# Patient Record
Sex: Female | Born: 1975 | Race: White | Hispanic: No | Marital: Single | Smoking: Never smoker
Health system: Southern US, Community
[De-identification: ages and names within clinical notes are randomized; demographics above are authoritative.]

## PROBLEM LIST (undated history)

## (undated) HISTORY — PX: NO PAST SURGERIES: SHX2092

---

## 2002-04-29 ENCOUNTER — Other Ambulatory Visit: Admission: RE | Admit: 2002-04-29 | Discharge: 2002-04-29 | Payer: Self-pay | Admitting: Obstetrics and Gynecology

## 2004-03-06 ENCOUNTER — Emergency Department (HOSPITAL_COMMUNITY): Admission: EM | Admit: 2004-03-06 | Discharge: 2004-03-06 | Payer: Self-pay | Admitting: Emergency Medicine

## 2007-04-07 ENCOUNTER — Ambulatory Visit: Payer: Self-pay | Admitting: Internal Medicine

## 2010-03-20 ENCOUNTER — Encounter: Admission: RE | Admit: 2010-03-20 | Discharge: 2010-03-20 | Payer: Self-pay | Admitting: Emergency Medicine

## 2012-09-24 ENCOUNTER — Ambulatory Visit (INDEPENDENT_AMBULATORY_CARE_PROVIDER_SITE_OTHER): Payer: BC Managed Care – PPO | Admitting: Emergency Medicine

## 2012-09-24 ENCOUNTER — Ambulatory Visit: Payer: BC Managed Care – PPO

## 2012-09-24 VITALS — BP 90/70 | HR 94 | Temp 98.5°F | Resp 18 | Wt 130.0 lb

## 2012-09-24 DIAGNOSIS — R079 Chest pain, unspecified: Secondary | ICD-10-CM

## 2012-09-24 DIAGNOSIS — J209 Acute bronchitis, unspecified: Secondary | ICD-10-CM

## 2012-09-24 NOTE — Progress Notes (Signed)
[facility] [address] [place] [address] [phone]  Date:  09/24/2012   Name:  Betty Diaz   DOB:  03/20/1976   MRN:  [id number]  PCP:  No primary provider on file.    Chief Complaint: URI and chest congestion   History of Present Illness:  Betty Diaz is a 36 y.o. very pleasant female patient who presents with the following:  Thursday working with a pile of money and spent the afternoon counting the bills. Since has experienced a sense of pressure in her anterior chest at the base of the throat.  Feels as though she needs to cough but cannot.  Denies wheezing or shortness of breath.  No DOE or orthopnea.  No radiation of discomfort. No nausea or vomiting.  No palpitations or rapid heart rate.  Denies indigestion or heart burn.  No waterbrash.  No trauma or overuse.  Symptoms are constant and unrelenting not affected by activity.  There is no problem list on file for this patient.   No past medical history on file.  No past surgical history on file.  History  Substance Use Topics  . Smoking status: Never Smoker   . Smokeless tobacco: Not on file  . Alcohol Use: Not on file    No family history on file.  Allergies  Allergen Reactions  . Penicillins     Medication list has been reviewed and updated.  No current outpatient prescriptions on file prior to visit.    Review of Systems:  As per HPI, otherwise negative.    Physical Examination: Filed Vitals:   09/24/12 1115  BP: 90/70  Pulse: 94  Temp: 98.5 F (36.9 C)  Resp: 18   Filed Vitals:   09/24/12 1115  Weight: 130 lb (58.968 kg)   There is no height on file to calculate BMI. Ideal Body Weight:    GEN: WDWN, NAD, Non-toxic, A & O x 3.  No sepsis rash or shortness of breath HEENT: Atraumatic, Normocephalic. Neck supple. No masses, No LAD. Ears and Nose: No external deformity. CV: RRR, No M/G/R. No JVD. No thrill. No extra heart sounds. PULM: CTA B, no  wheezes, crackles, rhonchi. No retractions. No resp. distress. No accessory muscle use. ABD: S, NT, ND, +BS. No rebound. No HSM. EXTR: No c/c/e NEURO Normal gait.  PSYCH: Normally interactive. Conversant. Not depressed or anxious appearing.  Calm demeanor.    Assessment and Plan: Chest pain Cough mucinex dm  Carmelina Dane, MD  [facility] reading (PRIMARY) by  Dr. Dareen Piano.  Normal chest.  I have reviewed and agree with documentation. Robert P. Merla Riches, M.D.

## 2013-12-07 ENCOUNTER — Ambulatory Visit: Payer: BC Managed Care – PPO

## 2013-12-07 ENCOUNTER — Ambulatory Visit (INDEPENDENT_AMBULATORY_CARE_PROVIDER_SITE_OTHER): Payer: BC Managed Care – PPO | Admitting: Internal Medicine

## 2013-12-07 VITALS — BP 112/70 | HR 75 | Temp 99.5°F | Resp 18 | Ht 64.5 in | Wt 131.0 lb

## 2013-12-07 DIAGNOSIS — R110 Nausea: Secondary | ICD-10-CM

## 2013-12-07 DIAGNOSIS — M545 Low back pain, unspecified: Secondary | ICD-10-CM

## 2013-12-07 DIAGNOSIS — R109 Unspecified abdominal pain: Secondary | ICD-10-CM

## 2013-12-07 DIAGNOSIS — R5383 Other fatigue: Secondary | ICD-10-CM

## 2013-12-07 DIAGNOSIS — M549 Dorsalgia, unspecified: Secondary | ICD-10-CM

## 2013-12-07 DIAGNOSIS — R5381 Other malaise: Secondary | ICD-10-CM

## 2013-12-07 LAB — COMPREHENSIVE METABOLIC PANEL
ALT: 29 U/L (ref 0–35)
AST: 29 U/L (ref 0–37)
Albumin: 4.5 g/dL (ref 3.5–5.2)
BUN: 8 mg/dL (ref 6–23)
CO2: 27 mEq/L (ref 19–32)
Chloride: 105 mEq/L (ref 96–112)
Creat: 0.56 mg/dL (ref 0.50–1.10)
Potassium: 4.2 mEq/L (ref 3.5–5.3)
Sodium: 137 mEq/L (ref 135–145)
Total Bilirubin: 0.3 mg/dL (ref 0.3–1.2)
Total Protein: 6.8 g/dL (ref 6.0–8.3)

## 2013-12-07 LAB — POCT URINALYSIS DIPSTICK
Ketones, UA: NEGATIVE
Protein, UA: NEGATIVE
Urobilinogen, UA: 0.2

## 2013-12-07 LAB — POCT UA - MICROSCOPIC ONLY
Casts, Ur, LPF, POC: NEGATIVE
Mucus, UA: NEGATIVE
Yeast, UA: NEGATIVE

## 2013-12-07 LAB — POCT CBC
HCT, POC: 39.8 % (ref 37.7–47.9)
Lymph, poc: 2.3 (ref 0.6–3.4)
MPV: 10.6 fL (ref 0–99.8)
POC LYMPH PERCENT: 29.9 %L (ref 10–50)
POC MID %: 7.3 %M (ref 0–12)
RBC: 4.08 M/uL (ref 4.04–5.48)
WBC: 7.6 10*3/uL (ref 4.6–10.2)

## 2013-12-07 LAB — TSH: TSH: 1.819 u[IU]/mL (ref 0.350–4.500)

## 2013-12-07 MED ORDER — POLYETHYLENE GLYCOL 3350 17 GM/SCOOP PO POWD: ORAL | Status: DC

## 2013-12-07 NOTE — Progress Notes (Signed)
This chart was scribed for Ellamae Sia, MD by Joaquin Music, ED Scribe. This patient was seen in room Room/bed 13 and the patient's care was started at 11:31 AM. Subjective:    Patient ID: Betty Diaz, female    DOB: Apr 10, 1976, 37 y.o.   MRN: [id number] Chief Complaint  Patient presents with  . Back Pain    X7 DAYS  . Nausea   HPI Betty Diaz is a 37 y.o. female who [facility] complaining of ongoing worsening L sided back pain, nausea, and decrease appetite and increase fatigue for the past 2 weeks that began 1 week ago. Pt states she has been monitoring her symptoms and is unsure why she is still having back pain. She suspects she is having L sided back spasms. She states her symptoms have "been all over the place and is unsure what may be her problem". She states she has been taking Iburprofen and applying Icy/Hot ointment without relief. She states her symptoms worsen during the morning but she has generally been worsening. She states she has had an increase in fatigue. Pt states she has been drinking water and cranberry juice to stay hydrated and due to her being concerned of a potential kidney infection. Pt denies dysuria, fever, cough, sore throat, back pain with deep breaths & movement.   Pt states as a child, she had kidney stones but denies having kidney stones as an adult. Pt states her LNMP was November 21st. Pt denies being sexually active and denies concern of pregnancy.  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 She states she gets 6-7 hours of sleep and states her sleep pattern has been normal and consistent.  History   Social History  . Marital Status: Married    Spouse Name: N/A    Number of Children: N/A  . Years of Education: N/A   Social History Main Topics  . Smoking status: Never Smoker   . Smokeless tobacco: None  . Alcohol Use: None  . Drug Use: None  . Sexual Activity: None   Other Topics Concern  . None   Social  History Narrative  . None   History reviewed. No pertinent past surgical history.  History reviewed. No pertinent family history.  Current outpatient prescriptions:ibuprofen (ADVIL,MOTRIN) 200 MG tablet, Take 200 mg by mouth every 6 (six) hours as needed., Disp: , Rfl:   Review of Systems A complete 10 system review of systems was obtained and all systems are negative except as noted in the HPI and PMH.   Objective:   Physical Exam Neck supple No cervical adenopathy Chest clear Heart regular without murmur Rib cage nontender Abdomen supple without organomegaly or masses or tenderness to palpation  No flank pain to percussion Thoracic spine nontender Lumbar spine nontender Straight leg raise normal bilaterally  [facility] reading (PRIMARY) by  Dr. Merla Riches normal with the addition of lots of stool, and air in the left splenic flexure within the bowel Results for orders placed in visit on 12/07/13  POCT URINALYSIS DIPSTICK      Result Value Range   Color, UA yellow     Clarity, UA clear     Glucose, UA neg     Bilirubin, UA neg     Ketones, UA neg     Spec Grav, UA <=1.005     Blood, UA moderate     pH, UA 5.0     Protein, UA neg     Urobilinogen, UA 0.2  Nitrite, UA neg     Leukocytes, UA Trace    POCT UA - MICROSCOPIC ONLY      Result Value Range   WBC, Ur, HPF, POC 0-2     RBC, urine, microscopic 0-3     Bacteria, U Microscopic trace     Mucus, UA neg     Epithelial cells, urine per micros 0-2     Crystals, Ur, HPF, POC neg     Casts, Ur, LPF, POC neg     Yeast, UA neg    POCT CBC      Result Value Range   WBC 7.6  4.6 - 10.2 K/uL   Lymph, poc 2.3  0.6 - 3.4   POC LYMPH PERCENT 29.9  10 - 50 %L   MID (cbc) 0.6  0 - 0.9   POC MID % 7.3  0 - 12 %M   POC Granulocyte 4.8  2 - 6.9   Granulocyte percent 62.8  37 - 80 %G   RBC 4.08  4.04 - 5.48 M/uL   Hemoglobin 12.6  12.2 - 16.2 g/dL   HCT, POC 40.9  81.1 - 47.9 %   MCV 97.6 (*) 80 - 97 fL   MCH, POC 30.9  27  - 31.2 pg   MCHC 31.7 (*) 31.8 - 35.4 g/dL   RDW, POC 91.4     Platelet Count, POC 221  142 - 424 K/uL   MPV 10.6  0 - 99.8 fL    Triage Vitals:BP 112/70  Pulse 75  Temp(Src) 99.5 F (37.5 C) (Oral)  Resp 18  Ht 5' 4.5" (1.638 m)  Wt 131 lb (59.421 kg)  BMI 22.15 kg/m2  SpO2 99%  LMP 10/30/2013 Assessment & Plan:   Back pain - Plan: POCT urinalysis dipstick, POCT UA - Microscopic Only, POCT CBC, POCT SEDIMENTATION RATE, Comprehensive metabolic panel, TSH, Urine culture, DG Lumbar Spine 2-3 Views  Nausea alone - Plan: POCT urinalysis dipstick, POCT UA - Microscopic Only, POCT CBC, POCT SEDIMENTATION RATE, Comprehensive metabolic panel, TSH, Urine culture  Flank pain - Plan: POCT CBC, POCT SEDIMENTATION RATE, Comprehensive metabolic panel, TSH, Urine culture, DG Lumbar Spine 2-3 Views  Fatigue - Plan: POCT CBC, POCT SEDIMENTATION RATE, Comprehensive metabolic panel, TSH, Urine culture   Constipation as a likely etiology and she will use MiraLax and followup in one week if not well///will contact about other labs I personally performed the services described in this documentation, which was scribed in my presence. The recorded information has been reviewed and is accurate.

## 2013-12-08 ENCOUNTER — Encounter: Payer: Self-pay | Admitting: Internal Medicine

## 2013-12-08 LAB — URINE CULTURE: Colony Count: NO GROWTH

## 2014-03-29 ENCOUNTER — Other Ambulatory Visit: Payer: Self-pay | Admitting: Obstetrics and Gynecology

## 2014-03-29 DIAGNOSIS — R928 Other abnormal and inconclusive findings on diagnostic imaging of breast: Secondary | ICD-10-CM

## 2014-04-07 ENCOUNTER — Ambulatory Visit
Admission: RE | Admit: 2014-04-07 | Discharge: 2014-04-07 | Disposition: A | Discharge status: Home / Self Care | Payer: BC Managed Care – PPO | Source: Ambulatory Visit | Attending: Obstetrics and Gynecology | Admitting: Obstetrics and Gynecology

## 2014-04-07 ENCOUNTER — Other Ambulatory Visit: Payer: Self-pay | Admitting: Obstetrics and Gynecology

## 2014-04-07 DIAGNOSIS — R928 Other abnormal and inconclusive findings on diagnostic imaging of breast: Secondary | ICD-10-CM

## 2014-07-04 ENCOUNTER — Telehealth: Payer: Self-pay

## 2014-07-04 NOTE — Telephone Encounter (Signed)
Patient came in to [id number] today stating that she needs a refill on a cream that she was given for some sort of allergy. Patient does not know the name of the medication or what allergy it was used for. States the prescription she had expired and she now needs a new one. Please return call and advise. CB # [phone]

## 2014-07-05 NOTE — Telephone Encounter (Signed)
lmom for pt to cb

## 2014-07-05 NOTE — Telephone Encounter (Signed)
Spoke to pt she is aware the medication she is requesting will require an office visit for evaluation.

## 2014-07-14 ENCOUNTER — Ambulatory Visit (INDEPENDENT_AMBULATORY_CARE_PROVIDER_SITE_OTHER): Payer: BC Managed Care – PPO | Admitting: Physician Assistant

## 2014-07-14 VITALS — BP 110/58 | HR 66 | Temp 97.4°F | Resp 16 | Ht 63.5 in | Wt 129.0 lb

## 2014-07-14 DIAGNOSIS — R21 Rash and other nonspecific skin eruption: Secondary | ICD-10-CM

## 2014-07-14 MED ORDER — TRIAMCINOLONE ACETONIDE 0.1 % EX CREA: 1.0000 "application " | TOPICAL_CREAM | Freq: Two times a day (BID) | CUTANEOUS | Status: DC

## 2014-07-14 NOTE — Progress Notes (Signed)
   Subjective:    Patient ID: Betty Diaz, female    DOB: Mar 08, 1976, 38 y.o.   MRN: [id number]  HPI 38 year old female presents for refill of TAC cream that she uses prn for an erythematous, pruritic rash that she gets under her arm and lateral aspect of her breast. Had extensive allergy workup "years" ago when this first occurred, no definitive cause determined. She has used TAC cream as needed and it has worked quite well.  Admits she has not had to use it in about a year, and so her rx had expired. Current rash is slightly pruritic and "small" but she does not want it to worsen.    Review of Systems  Constitutional: Negative for fever and chills.  Gastrointestinal: Negative for nausea and vomiting.  Skin: Positive for rash.  Neurological: Negative for dizziness and headaches.       Objective:   Physical Exam  Constitutional: She is oriented to person, place, and time. She appears well-developed and well-nourished.  HENT:  Head: Normocephalic.  Eyes: Conjunctivae are normal.  Neck: Normal range of motion.  Cardiovascular: Normal rate.   Pulmonary/Chest: Effort normal.  Neurological: She is alert and oriented to person, place, and time.  Skin:     Noted area is slightly erythematous. Not raised or tender. No vesicles, blisters, or drainage.   Psychiatric: She has a normal mood and affect. Her behavior is normal. Judgment and thought content normal.          Assessment & Plan:  Rash and other nonspecific skin eruption - Plan: triamcinolone cream (KENALOG) 0.1 % Allergic contact dermatitis ? etiology Refilled TAC to use as needed for pruritic rash. Recommend evaluation if symptoms fail to improve after 2 weeks of using cream, sooner if worse.

## 2014-09-06 ENCOUNTER — Other Ambulatory Visit: Payer: Self-pay | Admitting: Obstetrics and Gynecology

## 2014-09-06 DIAGNOSIS — R921 Mammographic calcification found on diagnostic imaging of breast: Secondary | ICD-10-CM

## 2014-09-29 ENCOUNTER — Encounter (INDEPENDENT_AMBULATORY_CARE_PROVIDER_SITE_OTHER): Payer: Self-pay

## 2014-09-29 ENCOUNTER — Ambulatory Visit
Admission: RE | Admit: 2014-09-29 | Discharge: 2014-09-29 | Disposition: A | Discharge status: Home / Self Care | Payer: BC Managed Care – PPO | Source: Ambulatory Visit | Attending: Obstetrics and Gynecology | Admitting: Obstetrics and Gynecology

## 2014-09-29 DIAGNOSIS — R921 Mammographic calcification found on diagnostic imaging of breast: Secondary | ICD-10-CM

## 2015-02-21 ENCOUNTER — Other Ambulatory Visit: Payer: Self-pay | Admitting: Obstetrics and Gynecology

## 2015-02-21 DIAGNOSIS — R921 Mammographic calcification found on diagnostic imaging of breast: Secondary | ICD-10-CM

## 2015-02-21 DIAGNOSIS — N6489 Other specified disorders of breast: Secondary | ICD-10-CM

## 2015-03-30 ENCOUNTER — Ambulatory Visit
Admission: RE | Admit: 2015-03-30 | Discharge: 2015-03-30 | Disposition: A | Discharge status: Home / Self Care | Payer: BLUE CROSS/BLUE SHIELD | Source: Ambulatory Visit | Attending: Obstetrics and Gynecology | Admitting: Obstetrics and Gynecology

## 2015-03-30 DIAGNOSIS — N6489 Other specified disorders of breast: Secondary | ICD-10-CM

## 2015-03-30 DIAGNOSIS — R921 Mammographic calcification found on diagnostic imaging of breast: Secondary | ICD-10-CM

## 2015-04-06 ENCOUNTER — Emergency Department (HOSPITAL_COMMUNITY): Payer: BLUE CROSS/BLUE SHIELD

## 2015-04-06 ENCOUNTER — Emergency Department (HOSPITAL_COMMUNITY)
Admission: EM | Admit: 2015-04-06 | Discharge: 2015-04-06 | Disposition: A | Discharge status: Home / Self Care | Payer: BLUE CROSS/BLUE SHIELD | Attending: Emergency Medicine | Admitting: Emergency Medicine

## 2015-04-06 ENCOUNTER — Encounter (HOSPITAL_COMMUNITY): Payer: Self-pay | Admitting: *Deleted

## 2015-04-06 DIAGNOSIS — R112 Nausea with vomiting, unspecified: Secondary | ICD-10-CM | POA: Insufficient documentation

## 2015-04-06 DIAGNOSIS — Z880 Allergy status to penicillin: Secondary | ICD-10-CM | POA: Insufficient documentation

## 2015-04-06 DIAGNOSIS — Z7952 Long term (current) use of systemic steroids: Secondary | ICD-10-CM | POA: Insufficient documentation

## 2015-04-06 DIAGNOSIS — R5383 Other fatigue: Secondary | ICD-10-CM | POA: Insufficient documentation

## 2015-04-06 DIAGNOSIS — M545 Low back pain, unspecified: Secondary | ICD-10-CM

## 2015-04-06 DIAGNOSIS — R109 Unspecified abdominal pain: Secondary | ICD-10-CM | POA: Insufficient documentation

## 2015-04-06 DIAGNOSIS — Z3202 Encounter for pregnancy test, result negative: Secondary | ICD-10-CM | POA: Diagnosis not present

## 2015-04-06 LAB — URINALYSIS, ROUTINE W REFLEX MICROSCOPIC
BILIRUBIN URINE: NEGATIVE
Glucose, UA: NEGATIVE mg/dL
Hgb urine dipstick: NEGATIVE
KETONES UR: 40 mg/dL — AB
LEUKOCYTES UA: NEGATIVE
NITRITE: NEGATIVE
Protein, ur: NEGATIVE mg/dL
SPECIFIC GRAVITY, URINE: 1.014 (ref 1.005–1.030)
UROBILINOGEN UA: 0.2 mg/dL (ref 0.0–1.0)
pH: 8 (ref 5.0–8.0)

## 2015-04-06 LAB — COMPREHENSIVE METABOLIC PANEL
ALBUMIN: 4 g/dL (ref 3.5–5.2)
ALT: 20 U/L (ref 0–35)
AST: 20 U/L (ref 0–37)
Alkaline Phosphatase: 58 U/L (ref 39–117)
Anion gap: 10 (ref 5–15)
BUN: 8 mg/dL (ref 6–23)
CALCIUM: 8.6 mg/dL (ref 8.4–10.5)
CO2: 22 mmol/L (ref 19–32)
Chloride: 103 mmol/L (ref 96–112)
Creatinine, Ser: 0.64 mg/dL (ref 0.50–1.10)
GFR calc Af Amer: >90 mL/min (ref >90)
GLUCOSE: 144 mg/dL — AB (ref 70–99)
POTASSIUM: 3.4 mmol/L — AB (ref 3.5–5.1)
SODIUM: 135 mmol/L (ref 135–145)
TOTAL PROTEIN: 6.6 g/dL (ref 6.0–8.3)
Total Bilirubin: 0.5 mg/dL (ref 0.3–1.2)

## 2015-04-06 LAB — CBC WITH DIFFERENTIAL/PLATELET
BASOS ABS: 0 10*3/uL (ref 0.0–0.1)
BASOS PCT: 0 % (ref 0–1)
Eosinophils Absolute: 0 10*3/uL (ref 0.0–0.7)
Eosinophils Relative: 0 % (ref 0–5)
HEMATOCRIT: 35.8 % — AB (ref 36.0–46.0)
Hemoglobin: 12.1 g/dL (ref 12.0–15.0)
Lymphocytes Relative: 9 % — ABNORMAL LOW (ref 12–46)
Lymphs Abs: 1 10*3/uL (ref 0.7–4.0)
MCH: 31.1 pg (ref 26.0–34.0)
MCHC: 33.8 g/dL (ref 30.0–36.0)
MCV: 92 fL (ref 78.0–100.0)
MONO ABS: 0.4 10*3/uL (ref 0.1–1.0)
Monocytes Relative: 3 % (ref 3–12)
NEUTROS ABS: 9.7 10*3/uL — AB (ref 1.7–7.7)
Neutrophils Relative %: 88 % — ABNORMAL HIGH (ref 43–77)
PLATELETS: 216 10*3/uL (ref 150–400)
RBC: 3.89 MIL/uL (ref 3.87–5.11)
RDW: 12.6 % (ref 11.5–15.5)
WBC: 11 10*3/uL — ABNORMAL HIGH (ref 4.0–10.5)

## 2015-04-06 LAB — LIPASE, BLOOD: LIPASE: 29 U/L (ref 11–59)

## 2015-04-06 LAB — POC URINE PREG, ED: Preg Test, Ur: NEGATIVE

## 2015-04-06 MED ORDER — MORPHINE SULFATE 4 MG/ML IJ SOLN
4.0000 mg | Freq: Once | INTRAMUSCULAR | Status: AC
  Administered 2015-04-06: 4 mg via INTRAVENOUS
  Filled 2015-04-06: qty 1

## 2015-04-06 MED ORDER — SODIUM CHLORIDE 0.9 % IV BOLUS (SEPSIS)
1000.0000 mL | Freq: Once | INTRAVENOUS | Status: AC
  Administered 2015-04-06: 1000 mL via INTRAVENOUS

## 2015-04-06 MED ORDER — ONDANSETRON HCL 4 MG/2ML IJ SOLN
4.0000 mg | Freq: Once | INTRAMUSCULAR | Status: AC
  Administered 2015-04-06: 4 mg via INTRAVENOUS
  Filled 2015-04-06: qty 2

## 2015-04-06 MED ORDER — IOHEXOL 300 MG/ML  SOLN
25.0000 mL | Freq: Once | INTRAMUSCULAR | Status: AC | PRN
  Administered 2015-04-06: 25 mL via ORAL

## 2015-04-06 MED ORDER — IOHEXOL 300 MG/ML  SOLN
80.0000 mL | Freq: Once | INTRAMUSCULAR | Status: AC | PRN
  Administered 2015-04-06: 80 mL via INTRAVENOUS

## 2015-04-06 NOTE — ED Notes (Signed)
Pt leaving for CT.  

## 2015-04-06 NOTE — Consult Note (Signed)
[facility] Consult Note  Betty Diaz 02-15-1976  [id number].    Requesting MD: Dr. Johnney Killian Chief Complaint/Reason for Consult: Nausea/vomiting, Concern for cholecystitis  HPI:  39 y/o [place] female presents to [facility] this morning with gradual onset of severe mid back pain which radiated to her LUQ.  It started between 2300-2400 last night (04/05/15) and became very severe at 3am.  She tried teas and water and tylenol without much relief.  Shortly after she started vomiting and eventually "had nothing left".  She's never hand anything like this before.  She admits to nausea, hot/cold sweats, chills, no no fever.  No changes in bowel or bladder habits.  No precipitating or alleviating factors.  LMP normal.  Has known ovarian cyst which her GYN is "watching".  No h/o gastrointestinal problems, no family history.  Pain was quite severe when she came to the hospital.  She denies any recent travel or sick contacts.  No abdominal surgeries or relevant PMH.  No medications.    ROS: All systems reviewed and otherwise negative except for as above  No family history on file.  History reviewed. No pertinent past medical history.  History reviewed. No pertinent past surgical history.  Social History:  reports that she has never smoked. She does not have any smokeless tobacco history on file. Her alcohol and drug histories are not on file.  Allergies:  Allergies  Allergen Reactions  . Penicillins      (Not in a hospital admission)  Blood pressure 104/64, pulse 57, temperature 97.8 F (36.6 C), temperature source Oral, resp. rate 16, last menstrual period 03/06/2015, SpO2 100 %. Physical Exam: General: pleasant, WD/WN white female who is laying in bed in NAD HEENT: head is normocephalic, atraumatic.  Sclera are noninjected.  PERRL.  Ears and nose without any masses or lesions.  Mouth is pink and moist Heart: regular, rate, and rhythm.  No obvious murmurs, gallops, or rubs  noted.  Palpable pedal pulses bilaterally Lungs: CTAB, no wheezes, rhonchi, or rales noted.  Respiratory effort nonlabored Abd: soft, NT/ND, negative murphys, no rebound or guarding, +BS, no masses, hernias, or organomegaly, no scars noted Back:  Minimal tenderness to left flank MS: all 4 extremities are symmetrical with no cyanosis, clubbing, or edema. Skin: warm and dry with no masses, lesions, or rashes Psych: A&Ox3 with an appropriate affect.   Results for orders placed or performed during the hospital encounter of 04/06/15 (from the past 48 hour(s))  Comprehensive metabolic panel     Status: Abnormal   Collection Time: 04/06/15  8:45 AM  Result Value Ref Range   Sodium 135 135 - 145 mmol/L   Potassium 3.4 (L) 3.5 - 5.1 mmol/L   Chloride 103 96 - 112 mmol/L   CO2 22 19 - 32 mmol/L   Glucose, Bld 144 (H) 70 - 99 mg/dL   BUN 8 6 - 23 mg/dL   Creatinine, Ser 0.64 0.50 - 1.10 mg/dL   Calcium 8.6 8.4 - 10.5 mg/dL   Total Protein 6.6 6.0 - 8.3 g/dL   Albumin 4.0 3.5 - 5.2 g/dL   AST 20 0 - 37 U/L   ALT 20 0 - 35 U/L   Alkaline Phosphatase 58 39 - 117 U/L   Total Bilirubin 0.5 0.3 - 1.2 mg/dL   GFR calc non Af Amer >90 >90 mL/min   GFR calc Af Amer >90 >90 mL/min    Comment: (NOTE) The eGFR has been calculated using the CKD EPI equation. This  calculation has not been validated in all clinical situations. eGFR's persistently <90 mL/min signify possible Chronic Kidney Disease.    Anion gap 10 5 - 15  Lipase, blood     Status: None   Collection Time: 04/06/15  8:45 AM  Result Value Ref Range   Lipase 29 11 - 59 U/L  CBC with Differential     Status: Abnormal   Collection Time: 04/06/15  8:45 AM  Result Value Ref Range   WBC 11.0 (H) 4.0 - 10.5 K/uL   RBC 3.89 3.87 - 5.11 MIL/uL   Hemoglobin 12.1 12.0 - 15.0 g/dL   HCT 35.8 (L) 36.0 - 46.0 %   MCV 92.0 78.0 - 100.0 fL   MCH 31.1 26.0 - 34.0 pg   MCHC 33.8 30.0 - 36.0 g/dL   RDW 12.6 11.5 - 15.5 %   Platelets 216 150 - 400  K/uL   Neutrophils Relative % 88 (H) 43 - 77 %   Neutro Abs 9.7 (H) 1.7 - 7.7 K/uL   Lymphocytes Relative 9 (L) 12 - 46 %   Lymphs Abs 1.0 0.7 - 4.0 K/uL   Monocytes Relative 3 3 - 12 %   Monocytes Absolute 0.4 0.1 - 1.0 K/uL   Eosinophils Relative 0 0 - 5 %   Eosinophils Absolute 0.0 0.0 - 0.7 K/uL   Basophils Relative 0 0 - 1 %   Basophils Absolute 0.0 0.0 - 0.1 K/uL  Urinalysis, Routine w reflex microscopic     Status: Abnormal   Collection Time: 04/06/15 10:49 AM  Result Value Ref Range   Color, Urine YELLOW YELLOW   APPearance HAZY (A) CLEAR   Specific Gravity, Urine 1.014 1.005 - 1.030   pH 8.0 5.0 - 8.0   Glucose, UA NEGATIVE NEGATIVE mg/dL   Hgb urine dipstick NEGATIVE NEGATIVE   Bilirubin Urine NEGATIVE NEGATIVE   Ketones, ur 40 (A) NEGATIVE mg/dL   Protein, ur NEGATIVE NEGATIVE mg/dL   Urobilinogen, UA 0.2 0.0 - 1.0 mg/dL   Nitrite NEGATIVE NEGATIVE   Leukocytes, UA NEGATIVE NEGATIVE    Comment: MICROSCOPIC NOT DONE ON URINES WITH NEGATIVE PROTEIN, BLOOD, LEUKOCYTES, NITRITE, OR GLUCOSE <1000 mg/dL.  POC urine preg, ED     Status: None   Collection Time: 04/06/15 10:54 AM  Result Value Ref Range   Preg Test, Ur NEGATIVE NEGATIVE    Comment:        THE SENSITIVITY OF THIS METHODOLOGY IS >24 mIU/mL    Ct Abdomen Pelvis W Contrast  04/06/2015   CLINICAL DATA:  Left-sided abdominal pain starting last night. Back pain. Nausea and vomiting.  EXAM: CT ABDOMEN AND PELVIS WITH CONTRAST  TECHNIQUE: Multidetector CT imaging of the abdomen and pelvis was performed using the standard protocol following bolus administration of intravenous contrast.  CONTRAST:  58mL OMNIPAQUE IOHEXOL 300 MG/ML  SOLN  COMPARISON:  03/20/2010 ; 12/07/2013  FINDINGS: Lower chest:  Unremarkable  Hepatobiliary: Abnormal wall thickening of the gallbladder with adjacent pericholecystic fluid tracking along the inferior right hepatic lobe margin. Low-grade periportal edema.  Pancreas: Unremarkable  Spleen:  Unremarkable  Adrenals/Urinary Tract: Unremarkable  Stomach/Bowel: Unremarkable.  Appendix normal.  Vascular/Lymphatic: Unremarkable  Reproductive: 3.0 by 2.4 cm cystic lesion of the right ovary, image 69 series 201. Suspected small anterior uterine body fibroid, approximately 10 mm in diameter on image 35 series 204.  Other: Small amount of free fluid in the right paracolic gutter.  Musculoskeletal: Unremarkable  IMPRESSION: 1. The dominant finding is abnormal  gallbladder wall thickening with pericholecystic fluid and a trace amount of free fluid along the liver margin and tracking in the right paracolic gutter. Appearance concerning for possible acute cholecystitis. Correlate clinically. If clinically warranted, nuclear medicine hepatobiliary scan could be utilized to assess for cystic duct patency. 2. 3.0 by 2.4 cm right ovarian cyst, likely incidental. 3. Small anterior uterine body fibroid.   Electronically Signed   By: Van Clines M.D.   On: 04/06/2015 15:16      Assessment/Plan LUQ abdominal pain - now resolved Mid back pain Nausea/vomiting Leukocytosis  Plan: 1.  No indications for surgery.  May need further workup to determine the cause of her pain, seems like it would be more likely left sided kidney stones based on the story. 2.  On CT her gallbladder DOES look edematous, distended, with pericholecystic fluid, but there are no stones, no RUQ or epigastric abdominal pain.  LFT's are normal, only mild leukocytosis.   3.  Don't fell she needs surgical admission 4.  Defer further workup to EDP and/or medicine 5.  Dr. Hulen Skains to see and evaluate as well and can give further recommendations   Coralie Keens, [facility] 04/06/2015, 4:25 PM Pager: [phone]

## 2015-04-06 NOTE — ED Notes (Signed)
Ct contacted that PO contrast is finished

## 2015-04-06 NOTE — ED Notes (Signed)
Lab contacted re: UA results, results estimated to be available in 10  mins

## 2015-04-06 NOTE — ED Notes (Signed)
Dansie, PA notified re: pts UA results

## 2015-04-06 NOTE — ED Notes (Signed)
Dancy, PA notified re: pts pain level increasing

## 2015-04-06 NOTE — ED Notes (Signed)
Pt in from home c/o mid bil back pain that radiates to bil lower back, at 3am the pt reports upper bil abd pain with n/v, denies diarrhea, c/o x 10 vomiting episodes, pt A&O x4, denies hematemesis

## 2015-04-06 NOTE — ED Provider Notes (Signed)
[facility].  39 yo female with sudden onset left back pain with some radiation to flank/left abdomen.  CT was notable for right sided abdominal findings including concern for possible cholecystitis.  On exam, well appearing, nontoxic, not distressed, normal respiratory effort, normal perfusion, no left sided back tenderness or swelling, no abdominal tenderness, no r/r/g.  General Surgery (Dr. Lindie SpruceWyatt) evaluated the patient in the ED and did not feel that her clinical picture represented acute cholecystitis or other surgical emergency.  I agree with his assessment that her symptoms sound more consistent with acute ureteral colic.  However, no stones seen on CT (although with IV contrast) and no hematuria.  No other acute lab findings to suggest etiology.  However, her symptoms are now resolved.  It seems appropriate to discharge home with close PCP follow up.  Advised to return to the ED if needed.    Clinical Impression: 1. Left-sided low back pain without sciatica   2. Acute left flank pain       Betty DivineJohn Terasa Orsini, MD 04/06/15 2007

## 2015-04-06 NOTE — ED Provider Notes (Signed)
CSN: [id number]     Arrival date & time 04/06/15  0749 History   First MD Initiated Contact with Patient 04/06/15 0802     Chief Complaint  Patient presents with  . Abdominal Pain   Betty Diaz is a 39 y.o. female who is otherwise healthy who presents to the ED complaining of left sided abdominal pain, nausea and vomiting for the past 8 hours. The patient reports that approximately 8 hours ago she started having left mid back pain, that moved into her left abdomen. This then progressed to nausea and vomiting. She reports vomiting approximately 10 times today. She complains of a 9 out of 10 left sideded abdominal pain and back pain that she describes as an ache. She denies any urinary symptoms. Last menstrual cycle was one month ago and was normal. She reports her last bowel movement was 2 hours ago and was normal. She denies diarrhea. The patient denies history of abdominal surgeries or GI problems. Patient denies fevers, alcohol use, sick contacts, hematemesis, hematochezia, diarrhea, urinary symptoms, hematuria, vaginal bleeding, vaginal discharge, cough or shortness of breath.  (Consider location/radiation/quality/duration/timing/severity/associated sxs/prior Treatment) HPI  History reviewed. No pertinent past medical history. History reviewed. No pertinent past surgical history. No family history on file. History  Substance Use Topics  . Smoking status: Never Smoker   . Smokeless tobacco: Not on file  . Alcohol Use: Not on file   OB History    No data available     Review of Systems  Constitutional: Positive for fatigue. Negative for fever and chills.  HENT: Negative for congestion and sore throat.   Eyes: Negative for visual disturbance.  Respiratory: Negative for cough and shortness of breath.   Cardiovascular: Negative for chest pain and palpitations.  Gastrointestinal: Positive for nausea, vomiting and abdominal pain. Negative for diarrhea, constipation and blood in stool.   Genitourinary: Negative for dysuria, urgency, frequency, hematuria, decreased urine volume, vaginal bleeding, vaginal discharge and difficulty urinating.  Musculoskeletal: Negative for neck pain.  Skin: Negative for rash.  Neurological: Negative for weakness, numbness and headaches.      Allergies  Penicillins  Home Medications   Prior to Admission medications   Medication Sig Start Date End Date Taking? Authorizing Provider  ibuprofen (ADVIL,MOTRIN) 200 MG tablet Take 200 mg by mouth every 6 (six) hours as needed.   Yes Historical Provider, MD  polyethylene glycol powder (GLYCOLAX/MIRALAX) powder 1 scoop full twice a day for 3 days and then once a day for 7 days Patient not taking: Reported on 04/06/2015 12/07/13   Tonye Pearsonobert P Doolittle, MD  triamcinolone cream (KENALOG) 0.1 % Apply 1 application topically 2 (two) times daily. Patient not taking: Reported on 04/06/2015 07/14/14   Heather M Marte, PA-C   BP 104/64 mmHg  Pulse 57  Temp(Src) 97.8 F (36.6 C) (Oral)  Resp 16  SpO2 100%  LMP 03/06/2015 Physical Exam  Constitutional: She is oriented to person, place, and time. She appears well-developed and well-nourished. No distress.  Non-toxic appearing. Appears to be in pain.   HENT:  Head: Normocephalic and atraumatic.  Mouth/Throat: Oropharynx is clear and moist. No oropharyngeal exudate.  Eyes: Conjunctivae are normal. Pupils are equal, round, and reactive to light. Right eye exhibits no discharge. Left eye exhibits no discharge.  Neck: Neck supple. No JVD present.  Cardiovascular: Normal rate, regular rhythm, normal heart sounds and intact distal pulses.  Exam reveals no gallop and no friction rub.   No murmur heard. Pulmonary/Chest: Effort normal and  breath sounds normal. No respiratory distress. She has no wheezes. She has no rales.  Abdominal: Soft. Bowel sounds are normal. She exhibits no distension and no mass. There is no tenderness. There is no rebound and no guarding.   Abdomen is soft. Bowel sounds are present. Abdomen is nontender to palpation. There is no CVA or flank tenderness. Negative psoas and obturator sign. No McBurney's point tenderness.  Musculoskeletal: She exhibits no edema or tenderness.  Lymphadenopathy:    She has no cervical adenopathy.  Neurological: She is alert and oriented to person, place, and time. Coordination normal.  Skin: Skin is warm and dry. No rash noted. She is not diaphoretic. No erythema. No pallor.  Psychiatric: She has a normal mood and affect. Her behavior is normal.  Nursing note and vitals reviewed.   ED Course  Procedures (including critical care time) Labs Review Labs Reviewed  COMPREHENSIVE METABOLIC PANEL - Abnormal; Notable for the following:    Potassium 3.4 (*)    Glucose, Bld 144 (*)    All other components within normal limits  CBC WITH DIFFERENTIAL/PLATELET - Abnormal; Notable for the following:    WBC 11.0 (*)    HCT 35.8 (*)    Neutrophils Relative % 88 (*)    Neutro Abs 9.7 (*)    Lymphocytes Relative 9 (*)    All other components within normal limits  URINALYSIS, ROUTINE W REFLEX MICROSCOPIC - Abnormal; Notable for the following:    APPearance HAZY (*)    Ketones, ur 40 (*)    All other components within normal limits  LIPASE, BLOOD  POC URINE PREG, ED    Imaging Review Ct Abdomen Pelvis W Contrast  04/06/2015   CLINICAL DATA:  Left-sided abdominal pain starting last night. Back pain. Nausea and vomiting.  EXAM: CT ABDOMEN AND PELVIS WITH CONTRAST  TECHNIQUE: Multidetector CT imaging of the abdomen and pelvis was performed using the standard protocol following bolus administration of intravenous contrast.  CONTRAST:  80mL OMNIPAQUE IOHEXOL 300 MG/ML  SOLN  COMPARISON:  03/20/2010 ; 12/07/2013  FINDINGS: Lower chest:  Unremarkable  Hepatobiliary: Abnormal wall thickening of the gallbladder with adjacent pericholecystic fluid tracking along the inferior right hepatic lobe margin. Low-grade  periportal edema.  Pancreas: Unremarkable  Spleen: Unremarkable  Adrenals/Urinary Tract: Unremarkable  Stomach/Bowel: Unremarkable.  Appendix normal.  Vascular/Lymphatic: Unremarkable  Reproductive: 3.0 by 2.4 cm cystic lesion of the right ovary, image 69 series 201. Suspected small anterior uterine body fibroid, approximately 10 mm in diameter on image 35 series 204.  Other: Small amount of free fluid in the right paracolic gutter.  Musculoskeletal: Unremarkable  IMPRESSION: 1. The dominant finding is abnormal gallbladder wall thickening with pericholecystic fluid and a trace amount of free fluid along the liver margin and tracking in the right paracolic gutter. Appearance concerning for possible acute cholecystitis. Correlate clinically. If clinically warranted, nuclear medicine hepatobiliary scan could be utilized to assess for cystic duct patency. 2. 3.0 by 2.4 cm right ovarian cyst, likely incidental. 3. Small anterior uterine body fibroid.   Electronically Signed   By: Gaylyn Rong M.D.   On: 04/06/2015 15:16     EKG Interpretation None      Filed Vitals:   04/06/15 1100 04/06/15 1130 04/06/15 1200 04/06/15 1230  BP: 106/59 100/61 93/57 104/64  Pulse: 48 70 62 57  Temp:      TempSrc:      Resp: SpO2: 100% 98% 100% 100%  MDM   Meds given in ED:  Medications  sodium chloride 0.9 % bolus 1,000 mL (0 mLs Intravenous Stopped 04/06/15 0926)  ondansetron (ZOFRAN) injection 4 mg (4 mg Intravenous Given 04/06/15 0843)  morphine 4 MG/ML injection 4 mg (4 mg Intravenous Given 04/06/15 0923)  morphine 4 MG/ML injection 4 mg (4 mg Intravenous Given 04/06/15 1108)  iohexol (OMNIPAQUE) 300 MG/ML solution 25 mL (25 mLs Oral Contrast Given 04/06/15 1214)  iohexol (OMNIPAQUE) 300 MG/ML solution 80 mL (80 mLs Intravenous Contrast Given 04/06/15 1450)    New Prescriptions   No medications on file    Final diagnoses:  Acute cholecystitis    This is a 39 y.o. female who is  otherwise healthy who presents to the ED complaining of left sided abdominal pain, nausea and vomiting for the past 8 hours. The patient reports that approximately 8 hours ago she started having left mid back pain, that moved into her left abdomen. This then progressed to nausea and vomiting. She reports vomiting approximately 10 times today. She complains of a 9 out of 10 left sideded abdominal pain and back pain that she describes as an ache. On exam the patient is afebrile and nontoxic appearing. She appears to be in pain. Her abdomen is really non-tender on my exam, but she points to epigastric, left upper quadrant and back pain that radiates from the left to right side.  Patient CMP shows a potassium of 3.4 and is otherwise unremarkable. Her liver enzymes are normal. She has normal lipase. Her CBC shows a white count of 11.0 is otherwise unremarkable. Urinalysis is negative for infection. She has a negative urine pregnancy test. Will obtain CT abdomen and pelvis with contrast as the patient's pain has continued.  CT abdomen and pelvis indicates abnormal gallbladder wall thickening with pericholecystic fluid and a trace amount of free fluid along the liver margin. The appearance is concerning for possible acute cholecystitis. Will consult general surgery. Barnetta Chapel, PA-C advised she will be down to see the patient. Likely admission.   This patient was discussed with and evaluated by Dr. Donnald Garre who agrees with assessment and plan.     Everlene Farrier, PA-C 04/06/15 1629  Arby Barrette, MD 04/09/15 [id number]

## 2015-04-06 NOTE — Discharge Instructions (Signed)
Abdominal Pain, Women °Abdominal (stomach, pelvic, or belly) pain can be caused by many things. It is important to tell your doctor: °· The location of the pain. °· Does it come and go or is it present all the time? °· Are there things that start the pain (eating certain foods, exercise)? °· Are there other symptoms associated with the pain (fever, nausea, vomiting, diarrhea)? °All of this is helpful to know when trying to find the cause of the pain. °CAUSES  °· Stomach: virus or bacteria infection, or ulcer. °· Intestine: appendicitis (inflamed appendix), regional ileitis (Crohn's disease), ulcerative colitis (inflamed colon), irritable bowel syndrome, diverticulitis (inflamed diverticulum of the colon), or cancer of the stomach or intestine. °· Gallbladder disease or stones in the gallbladder. °· Kidney disease, kidney stones, or infection. °· Pancreas infection or cancer. °· Fibromyalgia (pain disorder). °· Diseases of the female organs: °¨ Uterus: fibroid (non-cancerous) tumors or infection. °¨ Fallopian tubes: infection or tubal pregnancy. °¨ Ovary: cysts or tumors. °¨ Pelvic adhesions (scar tissue). °¨ Endometriosis (uterus lining tissue growing in the pelvis and on the pelvic organs). °¨ Pelvic congestion syndrome (female organs filling up with blood just before the menstrual period). °¨ Pain with the menstrual period. °¨ Pain with ovulation (producing an egg). °¨ Pain with an IUD (intrauterine device, birth control) in the uterus. °¨ Cancer of the female organs. °· Functional pain (pain not caused by a disease, may improve without treatment). °· Psychological pain. °· Depression. °DIAGNOSIS  °Your doctor will decide the seriousness of your pain by doing an examination. °· Blood tests. °· X-rays. °· Ultrasound. °· CT scan (computed tomography, special type of X-ray). °· MRI (magnetic resonance imaging). °· Cultures, for infection. °· Barium enema (dye inserted in the large intestine, to better view it with  X-rays). °· Colonoscopy (looking in intestine with a lighted tube). °· Laparoscopy (minor surgery, looking in abdomen with a lighted tube). °· Major abdominal exploratory surgery (looking in abdomen with a large incision). °TREATMENT  °The treatment will depend on the cause of the pain.  °· Many cases can be observed and treated at home. °· Over-the-counter medicines recommended by your caregiver. °· Prescription medicine. °· Antibiotics, for infection. °· Birth control pills, for painful periods or for ovulation pain. °· Hormone treatment, for endometriosis. °· Nerve blocking injections. °· Physical therapy. °· Antidepressants. °· Counseling with a psychologist or psychiatrist. °· Minor or major surgery. °HOME CARE INSTRUCTIONS  °· Do not take laxatives, unless directed by your caregiver. °· Take over-the-counter pain medicine only if ordered by your caregiver. Do not take aspirin because it can cause an upset stomach or bleeding. °· Try a clear liquid diet (broth or water) as ordered by your caregiver. Slowly move to a bland diet, as tolerated, if the pain is related to the stomach or intestine. °· Have a thermometer and take your temperature several times a day, and record it. °· Bed rest and sleep, if it helps the pain. °· Avoid sexual intercourse, if it causes pain. °· Avoid stressful situations. °· Keep your follow-up appointments and tests, as your caregiver orders. °· If the pain does not go away with medicine or surgery, you may try: °¨ Acupuncture. °¨ Relaxation exercises (yoga, meditation). °¨ Group therapy. °¨ Counseling. °SEEK MEDICAL CARE IF:  °· You notice certain foods cause stomach pain. °· Your home care treatment is not helping your pain. °· You need stronger pain medicine. °· You want your IUD removed. °· You feel faint or   lightheaded. °· You develop nausea and vomiting. °· You develop a rash. °· You are having side effects or an allergy to your medicine. °SEEK IMMEDIATE MEDICAL CARE IF:  °· Your  pain does not go away or gets worse. °· You have a fever. °· Your pain is felt only in portions of the abdomen. The right side could possibly be appendicitis. The left lower portion of the abdomen could be colitis or diverticulitis. °· You are passing blood in your stools (bright red or black tarry stools, with or without vomiting). °· You have blood in your urine. °· You develop chills, with or without a fever. °· You pass out. °MAKE SURE YOU:  °· Understand these instructions. °· Will watch your condition. °· Will get help right away if you are not doing well or get worse. °Document Released: 09/23/2007 Document Revised: 04/12/2014 Document Reviewed: 10/13/2009 °ExitCare® Patient Information ©2015 [employer]. This information is not intended to replace advice given to you by your health care provider. Make sure you discuss any questions you have with your health care provider. ° °Back Pain, Adult °Low back pain is very common. About 1 in 5 people have back pain. The cause of low back pain is rarely dangerous. The pain often gets better over time. About half of people with a sudden onset of back pain feel better in just 2 weeks. About 8 in 10 people feel better by 6 weeks.  °CAUSES °Some common causes of back pain include: °· Strain of the muscles or ligaments supporting the spine. °· Wear and tear (degeneration) of the spinal discs. °· Arthritis. °· Direct injury to the back. °DIAGNOSIS °Most of the time, the direct cause of low back pain is not known. However, back pain can be treated effectively even when the exact cause of the pain is unknown. Answering your caregiver's questions about your overall health and symptoms is one of the most accurate ways to make sure the cause of your pain is not dangerous. If your caregiver needs more information, he or she may order lab work or imaging tests (X-rays or MRIs). However, even if imaging tests show changes in your back, this usually does not require surgery. °HOME  CARE INSTRUCTIONS °For many people, back pain returns. Since low back pain is rarely dangerous, it is often a condition that people can learn to manage on their own.  °· Remain active. It is stressful on the back to sit or stand in one place. Do not sit, drive, or stand in one place for more than 30 minutes at a time. Take short walks on level surfaces as soon as pain allows. Try to increase the length of time you walk each day. °· Do not stay in bed. Resting more than 1 or 2 days can delay your recovery. °· Do not avoid exercise or work. Your body is made to move. It is not dangerous to be active, even though your back may hurt. Your back will likely heal faster if you return to being active before your pain is gone. °· Pay attention to your body when you  bend and lift. Many people have less discomfort when lifting if they bend their knees, keep the load close to their bodies, and avoid twisting. Often, the most comfortable positions are those that put less stress on your recovering back. °· Find a comfortable position to sleep. Use a firm mattress and lie on your side with your knees slightly bent. If you lie on your back, put a pillow under your knees. °· Only take over-the-counter or prescription medicines as   directed by your caregiver. Over-the-counter medicines to reduce pain and inflammation are often the most helpful. Your caregiver may prescribe muscle relaxant drugs. These medicines help dull your pain so you can more quickly return to your normal activities and healthy exercise. °· Put ice on the injured area. °¨ Put ice in a plastic bag. °¨ Place a towel between your skin and the bag. °¨ Leave the ice on for 15-20 minutes, 03-04 times a day for the first 2 to 3 days. After that, ice and heat may be alternated to reduce pain and spasms. °· Ask your caregiver about trying back exercises and gentle massage. This may be of some benefit. °· Avoid feeling anxious or stressed. Stress increases muscle tension  and can worsen back pain. It is important to recognize when you are anxious or stressed and learn ways to manage it. Exercise is a great option. °SEEK MEDICAL CARE IF: °· You have pain that is not relieved with rest or medicine. °· You have pain that does not improve in 1 week. °· You have new symptoms. °· You are generally not feeling well. °SEEK IMMEDIATE MEDICAL CARE IF:  °· You have pain that radiates from your back into your legs. °· You develop new bowel or bladder control problems. °· You have unusual weakness or numbness in your arms or legs. °· You develop nausea or vomiting. °· You develop abdominal pain. °· You feel faint. °Document Released: 11/26/2005 Document Revised: 05/27/2012 Document Reviewed: 03/30/2014 °ExitCare® Patient Information ©2015 [employer]. This information is not intended to replace advice given to you by your health care provider. Make sure you discuss any questions you have with your health care provider. ° °

## 2015-04-15 ENCOUNTER — Ambulatory Visit (INDEPENDENT_AMBULATORY_CARE_PROVIDER_SITE_OTHER): Payer: BLUE CROSS/BLUE SHIELD | Admitting: Emergency Medicine

## 2015-04-15 VITALS — BP 120/80 | HR 66 | Temp 98.2°F | Resp 16 | Ht 65.0 in | Wt 127.0 lb

## 2015-04-15 DIAGNOSIS — K819 Cholecystitis, unspecified: Secondary | ICD-10-CM

## 2015-04-15 LAB — CBC WITH DIFFERENTIAL/PLATELET
BASOS ABS: 0 10*3/uL (ref 0.0–0.1)
Basophils Relative: 0 % (ref 0–1)
Eosinophils Absolute: 0.2 10*3/uL (ref 0.0–0.7)
Eosinophils Relative: 2 % (ref 0–5)
HCT: 39.5 % (ref 36.0–46.0)
HEMOGLOBIN: 13.5 g/dL (ref 12.0–15.0)
LYMPHS ABS: 2.6 10*3/uL (ref 0.7–4.0)
LYMPHS PCT: 34 % (ref 12–46)
MCH: 31.1 pg (ref 26.0–34.0)
MCHC: 34.2 g/dL (ref 30.0–36.0)
MCV: 91 fL (ref 78.0–100.0)
MPV: 11.4 fL (ref 8.6–12.4)
Monocytes Absolute: 0.8 10*3/uL (ref 0.1–1.0)
Monocytes Relative: 10 % (ref 3–12)
Neutro Abs: 4.2 10*3/uL (ref 1.7–7.7)
Neutrophils Relative %: 54 % (ref 43–77)
Platelets: 294 10*3/uL (ref 150–400)
RBC: 4.34 MIL/uL (ref 3.87–5.11)
RDW: 13 % (ref 11.5–15.5)
WBC: 7.7 10*3/uL (ref 4.0–10.5)

## 2015-04-15 LAB — COMPREHENSIVE METABOLIC PANEL
ALK PHOS: 64 U/L (ref 39–117)
ALT: 14 U/L (ref 0–35)
AST: 15 U/L (ref 0–37)
Albumin: 4.5 g/dL (ref 3.5–5.2)
BUN: 7 mg/dL (ref 6–23)
CO2: 31 meq/L (ref 19–32)
CREATININE: 0.63 mg/dL (ref 0.50–1.10)
Calcium: 9.5 mg/dL (ref 8.4–10.5)
Chloride: 103 mEq/L (ref 96–112)
Glucose, Bld: 88 mg/dL (ref 70–99)
Potassium: 4.2 mEq/L (ref 3.5–5.3)
SODIUM: 139 meq/L (ref 135–145)
TOTAL PROTEIN: 7.1 g/dL (ref 6.0–8.3)
Total Bilirubin: 0.7 mg/dL (ref 0.2–1.2)

## 2015-04-15 LAB — POCT GLYCOSYLATED HEMOGLOBIN (HGB A1C): HEMOGLOBIN A1C: 5.2

## 2015-04-15 MED ORDER — SUCRALFATE 1 G PO TABS: ORAL_TABLET | ORAL | Status: DC

## 2015-04-15 MED ORDER — LANSOPRAZOLE 30 MG PO CPDR: 30.0000 mg | DELAYED_RELEASE_CAPSULE | Freq: Every day | ORAL | Status: DC

## 2015-04-15 NOTE — Addendum Note (Signed)
Addended by: Carmelina DaneANDERSON, Lathan Gieselman S on: 04/15/2015 10:50 AM   Modules accepted: Orders

## 2015-04-15 NOTE — Progress Notes (Addendum)
[facility] [address], [place] [id number] [phone]  Date:  04/15/2015   Name:  Betty Diaz   DOB:  26-Jun-1976   MRN:  [id number]  PCP:  No primary care provider on file.    Chief Complaint: Hospitalization Follow-up   History of Present Illness:  Betty Diaz is a 39 y.o. very pleasant female patient who presents with the following:  Seen in er a week ago with abdominal pain and vomiting. CT done was suggestive of acalculous cholecystitis but no follow up testing was ordered.  No stones Continues to have upper abdominal distress and unwillingness to eat out of fear of provoking pain No further vomiting but nauseated at times. No fever or chills Strong family history of gallbladder disease No stool change No improvement with over the counter medications or other home remedies.  Denies other complaint or health concern today.   There are no active problems to display for this patient.   History reviewed. No pertinent past medical history.  History reviewed. No pertinent past surgical history.  History  Substance Use Topics  . Smoking status: Never Smoker   . Smokeless tobacco: Not on file  . Alcohol Use: Not on file    History reviewed. No pertinent family history.  Allergies  Allergen Reactions  . Penicillins     Medication list has been reviewed and updated.  Current Outpatient Prescriptions on File Prior to Visit  Medication Sig Dispense Refill  . ibuprofen (ADVIL,MOTRIN) 200 MG tablet Take 200 mg by mouth every 6 (six) hours as needed.    . polyethylene glycol powder (GLYCOLAX/MIRALAX) powder 1 scoop full twice a day for 3 days and then once a day for 7 days (Patient not taking: Reported on 04/06/2015) 255 g 0  . triamcinolone cream (KENALOG) 0.1 % Apply 1 application topically 2 (two) times daily. (Patient not taking: Reported on 04/06/2015) 30 g 2   No current facility-administered medications on file prior to visit.     Review of Systems:  Review of Systems  Constitutional: Negative for fever, chills and fatigue.  HENT: Negative for congestion, ear pain, hearing loss, postnasal drip, rhinorrhea and sinus pressure.   Eyes: Negative for discharge and redness.  Respiratory: Negative for cough, shortness of breath and wheezing.   Cardiovascular: Negative for chest pain and leg swelling.  Gastrointestinal: Negative for nausea, vomiting, abdominal pain, constipation and blood in stool.  Genitourinary: Negative for dysuria, urgency and frequency.  Musculoskeletal: Negative for neck stiffness.  Skin: Negative for rash.  Neurological: Negative for seizures, weakness and headaches.   Physical Examination: Filed Vitals:   04/15/15 1016  BP: 120/80  Pulse: 66  Temp: 98.2 F (36.8 C)  Resp: 16   Filed Vitals:   04/15/15 1016  Height: 5\' 5"  (1.651 m)  Weight: 127 lb (57.607 kg)   Body mass index is 21.13 kg/(m^2). Ideal Body Weight: Weight in (lb) to have BMI = 25: 149.9  GEN: WDWN, NAD, Non-toxic, A & O x 3 HEENT: Atraumatic, Normocephalic. Neck supple. No masses, No LAD. Ears and Nose: No external deformity. CV: RRR, No M/G/R. No JVD. No thrill. No extra heart sounds. PULM: CTA B, no wheezes, crackles, rhonchi. No retractions. No resp. distress. No accessory muscle use. ABD: S, discomfort RUQ no mass, ND, +BS. No rebound. No HSM. EXTR: No c/c/e NEURO Normal gait.  PSYCH: Normally interactive. Conversant. Not depressed or anxious appearing.  Calm demeanor.    Assessment and Plan: Abdominal  pain Acalculous cholecystitis by ER note Will get HIDDA scan with CCK Elevated glucose Labs Prevacid carafate  Signed Phillips OdorJeffery Crewe Heathman, MD   Results for orders placed or performed in visit on 04/15/15  POCT glycosylated hemoglobin (Hb A1C)  Result Value Ref Range   Hemoglobin A1C 5.2

## 2015-04-15 NOTE — Addendum Note (Signed)
Addended by: Eddie CandleLUCK, Swayzee Wadley L on: 04/15/2015 11:05 AM   Modules accepted: Orders

## 2015-04-15 NOTE — Patient Instructions (Signed)

## 2015-04-18 LAB — H. PYLORI BREATH TEST: H. pylori Breath Test: NOT DETECTED

## 2015-05-03 ENCOUNTER — Ambulatory Visit (HOSPITAL_COMMUNITY)
Admission: RE | Admit: 2015-05-03 | Discharge: 2015-05-03 | Disposition: A | Discharge status: Home / Self Care | Payer: BLUE CROSS/BLUE SHIELD | Source: Ambulatory Visit | Attending: Emergency Medicine | Admitting: Emergency Medicine

## 2015-05-03 DIAGNOSIS — R112 Nausea with vomiting, unspecified: Secondary | ICD-10-CM | POA: Diagnosis not present

## 2015-05-03 DIAGNOSIS — R938 Abnormal findings on diagnostic imaging of other specified body structures: Secondary | ICD-10-CM | POA: Diagnosis not present

## 2015-05-03 DIAGNOSIS — R109 Unspecified abdominal pain: Secondary | ICD-10-CM | POA: Diagnosis present

## 2015-05-03 DIAGNOSIS — K819 Cholecystitis, unspecified: Secondary | ICD-10-CM

## 2015-05-03 MED ORDER — TECHNETIUM TC 99M MEBROFENIN IV KIT
5.0000 | PACK | Freq: Once | INTRAVENOUS | Status: AC | PRN
  Administered 2015-05-03: 5 via INTRAVENOUS

## 2015-05-03 MED ORDER — SINCALIDE 5 MCG IJ SOLR
0.0200 ug/kg | Freq: Once | INTRAMUSCULAR | Status: AC
  Administered 2015-05-03: 1.15 ug via INTRAVENOUS

## 2015-05-03 MED ORDER — SINCALIDE 5 MCG IJ SOLR
INTRAMUSCULAR | Status: AC
  Filled 2015-05-03: qty 5

## 2015-05-04 ENCOUNTER — Encounter: Payer: Self-pay | Admitting: Family Medicine

## 2015-05-04 ENCOUNTER — Ambulatory Visit (INDEPENDENT_AMBULATORY_CARE_PROVIDER_SITE_OTHER): Payer: BLUE CROSS/BLUE SHIELD | Admitting: Family Medicine

## 2015-05-04 VITALS — BP 101/60 | HR 63 | Temp 97.4°F | Ht 65.0 in | Wt 129.9 lb

## 2015-05-04 DIAGNOSIS — Z800 Family history of malignant neoplasm of digestive organs: Secondary | ICD-10-CM

## 2015-05-04 DIAGNOSIS — Z0000 Encounter for general adult medical examination without abnormal findings: Secondary | ICD-10-CM | POA: Diagnosis not present

## 2015-05-04 NOTE — Progress Notes (Signed)
  Patient ID: Betty Diaz, female   DOB: 04-08-1976, 39 y.o.   MRN: [id number] Patient ID: Betty Diaz, female   DOB: 04-08-1976, 39 y.o.   MRN: [id number]  Chief Complaint  Patient presents with  . new patient    HPI Betty Diaz is a 39 y.o. female.  Here to establish care  HPI  PMHx - Non contributory   Past Surgical History  Procedure Laterality Date  . No past surgeries      Family History  Problem Relation Age of Onset  . Cholecystitis Father   . Cancer Father     Colon @ 4656-57    Social History History  Substance Use Topics  . Smoking status: Never Smoker   . Smokeless tobacco: Not on file  . Alcohol Use: No    Allergies  Allergen Reactions  . Penicillins     No current outpatient prescriptions on file.   No current facility-administered medications for this visit.    Review of Systems Review of Systems  Blood pressure 101/60, pulse 63, temperature 97.4 F (36.3 C), temperature source Oral, height 5\' 5"  (1.651 m), weight 129 lb 14.4 oz (58.922 kg), last menstrual period 04/12/2015.  Physical Exam Physical Exam  NAD RRR No Murmurs appreciated  WN/WD No edema    Assessment 39 y/o Female here for general physical/establish care   Plan Pt is a very healthy 39 y/o.  Not at risk for diabetes or hypertension.  Will hold off screening for diabetes as she is extremely active, thin, healthy.  Only recommendation today is to have colon cancer screening around age 41-47 as her father had colon cancer dx around 6456-57.  F/U in 1 yr.   Betty Diaz, Betty Diaz 05/04/2015, 4:59 PM

## 2015-05-04 NOTE — Assessment & Plan Note (Signed)
Father with Colon Adenocarcinoma at age 41-57.   - Consider Colonoscopy around age 41-47, which was discussed today

## 2016-01-24 ENCOUNTER — Ambulatory Visit (INDEPENDENT_AMBULATORY_CARE_PROVIDER_SITE_OTHER): Payer: BLUE CROSS/BLUE SHIELD | Admitting: Family Medicine

## 2016-01-24 ENCOUNTER — Encounter: Payer: Self-pay | Admitting: Family Medicine

## 2016-01-24 VITALS — BP 98/60 | HR 65 | Temp 98.0°F | Ht 65.0 in | Wt 136.7 lb

## 2016-01-24 DIAGNOSIS — R51 Headache: Secondary | ICD-10-CM

## 2016-01-24 DIAGNOSIS — R519 Headache, unspecified: Secondary | ICD-10-CM

## 2016-01-24 MED ORDER — FLUTICASONE PROPIONATE 50 MCG/ACT NA SUSP: 2.0000 | Freq: Every day | NASAL | Status: DC

## 2016-01-24 MED ORDER — LORATADINE 10 MG PO TABS: 10.0000 mg | ORAL_TABLET | Freq: Every day | ORAL | Status: DC

## 2016-01-24 NOTE — Assessment & Plan Note (Signed)
Occurs mostly in the winter months and is daily during this time of year. Leading differential is allergies causing headaches due to frontal sinus location with nasal congestion and mucosal edema findings. Other differentials include chronic sinusitis, migraine, or tension headache.  - Begin antihistamine therapy (Loratadine 10 mg daily) and Flonase - Tylenol PRN - Follow up in 1 month

## 2016-01-24 NOTE — Progress Notes (Signed)
   Subjective:    Patient ID: Betty Diaz , female   DOB: 1976/08/03 , 40 y.o..   MRN: [id number]  HPI  Betty Diaz is a 40 y.o. female who complains of headaches during the winter time for about 10 years. Description of pain: sharp pain, near her forehead and eybrows. Duration of individual headaches: all day long, frequency is almost daily. Associated symptoms: congestion and loud noises (especially when she wears her headphones for work). Pain relief: acetaminophen but that only works sometimes. Has tried Ibuprofen and normal saline spray in the past without relief. Precipitating factors: stress and cold season. She denies a history of recent head injury. Does admit to having surgery in [place] around 1999 or 1998 to fix a deviated septum in her nose. Prior neurological history: negative for no neurological problems, migraine headaches, stroke, brain neoplasms, seizure disorders, major head injuries. Neurologic Review of Systems - no TIA or stroke-like symptoms, no amaurosis, diplopia, abnormal speech, unilateral numbness or weakness.  Review of Systems: Per HPI. All other systems reviewed and are negative.  Medications: reviewed and updated  Social Hx:   reports that she has never smoked. She does not have any smokeless tobacco history on file.    Objective:   BP 98/60 mmHg  Pulse 65  Temp(Src) 98 F (36.7 C) (Oral)  Ht  (1.651 m)  Wt 136 lb 11.2 oz (62.007 kg)  BMI 22.75 kg/m2  LMP 01/23/2016  Physical Exam  Constitutional: She appears well-developed and well-nourished. No distress.  HENT:  Head: Normocephalic and atraumatic.  Right Ear: Tympanic membrane, external ear and ear canal normal. Tympanic membrane is not bulging.  Left Ear: Tympanic membrane, external ear and ear canal normal. Tympanic membrane is not bulging.  Nose: Mucosal edema present. No nasal deformity or septal deviation.  No foreign bodies. Right sinus exhibits no maxillary sinus tenderness and no  frontal sinus tenderness. Left sinus exhibits no maxillary sinus tenderness and no frontal sinus tenderness.  Mouth/Throat: Uvula is midline, oropharynx is clear and moist and mucous membranes are normal. No posterior oropharyngeal edema.  Left nasal passage almost completely obstructed due to swelling   Neurological Exam: alert, oriented, normal speech, no focal findings or movement disorder noted, neck supple without rigidity, cranial nerves II through XII intact, motor and sensory grossly normal bilaterally, normal muscle tone, no tremors, strength 5/5. Psych: good insight, alert and oriented     Assessment & Plan:  Headache Occurs mostly in the winter months and is daily during this time of year. Leading differential is allergies causing headaches due to frontal sinus location with nasal congestion and mucosal edema findings. Other differentials include chronic sinusitis, migraine, or tension headache.  - Begin antihistamine therapy (Loratadine 10 mg daily) and Flonase - Tylenol PRN - Follow up in 1 month

## 2016-01-24 NOTE — Patient Instructions (Addendum)
Thank you for coming in today, it was so nice to meet you!  For your headache, I think your pain is coming from your sinuses. I would like to start you on an antihistamine medication and a steroid nasal spray. I will send these 2 prescriptions to your pharmacy. You can also continue taking Tylenol as needed.   Please follow up with me in 1 month to see how this treatment is working.   If you have any questions or concerns, please do not hesitate to call the office at [phone].  Sincerely,  Anders Simmonds, MD

## 2016-02-20 ENCOUNTER — Encounter: Payer: BLUE CROSS/BLUE SHIELD | Admitting: Family Medicine

## 2016-02-20 NOTE — Patient Instructions (Signed)
Thank you for coming in today, it was nice to meet you!

## 2016-02-20 NOTE — Progress Notes (Signed)
  This morning patient woke up, and stated that the right one started to hurt. Hurts all the time.  Congestion for 2-3 days. Father tried baby ear drops from a previous ear drops which helped the patient, and it helped a little. Fathe cannot recall the name of the ear drops (it was a genereic brand from [facility]). Mothe r of patient is also sic, just got over the flu. Denies fevers, diarrhea, vomiting, sore throat.  Positive coughing, no shortness of breath  Father is not sure if he had flu shot this year. Good PO intake.

## 2016-03-12 ENCOUNTER — Other Ambulatory Visit: Payer: Self-pay | Admitting: Obstetrics and Gynecology

## 2016-03-12 DIAGNOSIS — R921 Mammographic calcification found on diagnostic imaging of breast: Secondary | ICD-10-CM

## 2016-03-22 IMAGING — NM NM HEPATO W/GB/PHARM/[PERSON_NAME]
2 series · 12 of 12 positions shown · non-contrast
Comparison: CT scan of the abdomen and pelvis of April 06, 2015

CLINICAL DATA: Abdominal pain and nausea and vomiting with onset on
April 06, 2015 ; abnormal gallbladder on CT scan

EXAM:
NUCLEAR MEDICINE HEPATOBILIARY IMAGING WITH GALLBLADDER EF
TECHNIQUE: Sequential images of the abdomen were obtained [DATE] minutes
following intravenous administration of radiopharmaceutical. After
slow intravenous infusion of 1.15 micrograms Cholecystokinin,
gallbladder ejection fraction was determined.
RADIOPHARMACEUTICALS:  5.0 mCi Nechnetium-PPm Choletec IV

[he hepatobiliary · 3.43mm/px · 6 of 53 frames shown (1 of 2)]
[frame 5/53]
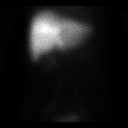
[frame 13/53]
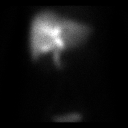
[frame 22/53]
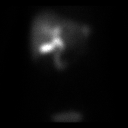
[frame 31/53]
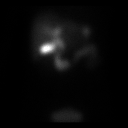
[frame 40/53]
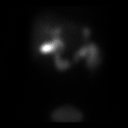
[frame 49/53]
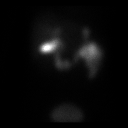

[he hepatobiliary · 3.43mm/px · 6 of 45 frames shown (2 of 2)]
[frame 4/45]
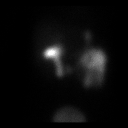
[frame 12/45]
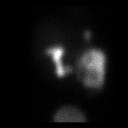
[frame 19/45]
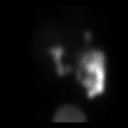
[frame 27/45]
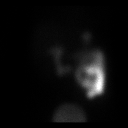
[frame 34/45]
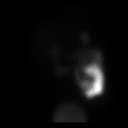
[frame 42/45]
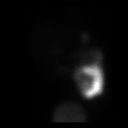

[12 of 12 positions shown; findings below may reference images not displayed]

FINDINGS: There is adequate uptake of the radiopharmaceutical by the liver.
The common bile duct is visible by 10 minutes and the gallbladder
and bowel by 15 minutes. Following CCK administration the 45 minutes
gallbladder ejection fraction is 94%. The patient experienced no
symptoms during the CCK infusion.. At 45 min, normal ejection
fraction is greater than 40%.
IMPRESSION: Normal hepatobiliary scan with normal gallbladder ejection fraction.

## 2016-04-04 ENCOUNTER — Ambulatory Visit
Admission: RE | Admit: 2016-04-04 | Discharge: 2016-04-04 | Disposition: A | Discharge status: Home / Self Care | Payer: BLUE CROSS/BLUE SHIELD | Source: Ambulatory Visit | Attending: Obstetrics and Gynecology | Admitting: Obstetrics and Gynecology

## 2016-04-04 ENCOUNTER — Other Ambulatory Visit: Payer: Self-pay | Admitting: Obstetrics and Gynecology

## 2016-04-04 DIAGNOSIS — R921 Mammographic calcification found on diagnostic imaging of breast: Secondary | ICD-10-CM

## 2016-04-11 ENCOUNTER — Ambulatory Visit
Admission: RE | Admit: 2016-04-11 | Discharge: 2016-04-11 | Disposition: A | Discharge status: Home / Self Care | Payer: BLUE CROSS/BLUE SHIELD | Source: Ambulatory Visit | Attending: Obstetrics and Gynecology | Admitting: Obstetrics and Gynecology

## 2016-04-11 DIAGNOSIS — R921 Mammographic calcification found on diagnostic imaging of breast: Secondary | ICD-10-CM

## 2016-05-05 ENCOUNTER — Ambulatory Visit (INDEPENDENT_AMBULATORY_CARE_PROVIDER_SITE_OTHER): Payer: BLUE CROSS/BLUE SHIELD | Admitting: Family Medicine

## 2016-05-05 VITALS — BP 118/68 | HR 63 | Temp 97.9°F | Resp 16 | Ht 66.0 in | Wt 135.0 lb

## 2016-05-05 DIAGNOSIS — R309 Painful micturition, unspecified: Secondary | ICD-10-CM | POA: Diagnosis not present

## 2016-05-05 LAB — POCT URINALYSIS DIP (MANUAL ENTRY)
Bilirubin, UA: NEGATIVE
Blood, UA: NEGATIVE
Glucose, UA: NEGATIVE
Ketones, POC UA: NEGATIVE
NITRITE UA: NEGATIVE
PH UA: 8
Protein Ur, POC: NEGATIVE
Spec Grav, UA: 1.01
Urobilinogen, UA: 0.2

## 2016-05-05 LAB — POC MICROSCOPIC URINALYSIS (UMFC)

## 2016-05-05 MED ORDER — CIPROFLOXACIN HCL 250 MG PO TABS: 250.0000 mg | ORAL_TABLET | Freq: Two times a day (BID) | ORAL | Status: AC

## 2016-05-05 NOTE — Progress Notes (Signed)
Subjective:    Patient ID: Betty Diaz, female    DOB: [birth year], 40 y.o.   MRN: [id number]  05/05/2016  discomfort after urination and Back Pain   HPI This 40 y.o. female presents for evaluation of dysuria, back pain.  No severe symptoms.  Started with lower pelvic pain in ovarian region.  Had menses two weeks ago; thought my be related to menstrual cramping.  Biggest concern with with post-void discomfort.  No dysuria with urination.  Symptoms develop at end of urination.  Called gynecologist; prescribed medication; took one tablet; did not feel well after medication. No improvement with medicaiton; developed nausea after medication. After taking medication, developed back pain. Called gyencologist; advised that not toelrating medication well.  Dropped of urine specimen that was fine; offered appointment next week.  Did not want to wait until next week.  Too busy to wait until next week.  No fever/chills/sweats.  No nausea or vomiting excpet after NitroFuratoin.  Tylenol has helped with pelvic pain.  Still having discomfort post-void; +tingling after urination; +urgency for 1-2 minutes.  No frequency.  No hematuria.  Recent normal menses on 04-18-2016.  No vaginal irritaiton or itching.  No vaginal discharge.  Single.  Dating.  Sexually active; same partner for years.  No vaginal discomfrt. S/p breast bx L; negative.  No vaginal odor.  Getting married in July 2017.   Dr. Myrlene Broker.    Review of Systems  Constitutional: Negative for fever, chills, diaphoresis and fatigue.  Eyes: Negative for visual disturbance.  Respiratory: Negative for cough and shortness of breath.   Cardiovascular: Negative for chest pain, palpitations and leg swelling.  Gastrointestinal: Negative for nausea, vomiting, abdominal pain, diarrhea and constipation.  Endocrine: Negative for cold intolerance, heat intolerance, polydipsia, polyphagia and polyuria.  Genitourinary: Positive for dysuria, urgency and  pelvic pain. Negative for frequency, hematuria, flank pain, decreased urine volume, vaginal bleeding, vaginal discharge, enuresis, difficulty urinating, genital sores, vaginal pain, menstrual problem and dyspareunia.  Neurological: Negative for dizziness, tremors, seizures, syncope, facial asymmetry, speech difficulty, weakness, light-headedness, numbness and headaches.    History reviewed. No pertinent past medical history. Past Surgical History  Procedure Laterality Date  . No past surgeries     Allergies  Allergen Reactions  . Penicillins    Current Outpatient Prescriptions  Medication Sig Dispense Refill  . acetaminophen (TYLENOL) 325 MG tablet Take 650 mg by mouth every 6 (six) hours as needed.     No current facility-administered medications for this visit.   Social History   Social History  . Marital Status: Single    Spouse Name: N/A  . Number of Children: N/A  . Years of Education: N/A   Occupational History  . Bank     Social History Main Topics  . Smoking status: Never Smoker   . Smokeless tobacco: Never Used  . Alcohol Use: No  . Drug Use: No  . Sexual Activity: Yes   Other Topics Concern  . Not on file   Social History Narrative   Sales at [employer]    Family History  Problem Relation Age of Onset  . Cholecystitis Father   . Cancer Father     Colon @ 56-57       Objective:    BP 118/68 mmHg  Pulse 63  Temp(Src) 97.9 F (36.6 C) (Oral)  Resp 16  Ht  (1.676 m)  Wt 135 lb (61.236 kg)  BMI 21.80 kg/m2  SpO2 99%  LMP 04/18/2016 (Approximate)  Physical Exam  Constitutional: She is oriented to person, place, and time. She appears well-developed and well-nourished. No distress.  HENT:  Head: Normocephalic and atraumatic.  Right Ear: External ear normal.  Left Ear: External ear normal.  Nose: Nose normal.  Mouth/Throat: Oropharynx is clear and moist.  Eyes: Conjunctivae and EOM are normal. Pupils are equal, round, and reactive to light.    Neck: Normal range of motion and full passive range of motion without pain. Neck supple. No JVD present. Carotid bruit is not present. No thyromegaly present.  Cardiovascular: Normal rate, regular rhythm and normal heart sounds.  Exam reveals no gallop and no friction rub.   No murmur heard. Pulmonary/Chest: Effort normal and breath sounds normal. She has no wheezes. She has no rales.  Abdominal: Soft. Bowel sounds are normal. She exhibits no distension and no mass. There is no tenderness. There is no rebound and no guarding.  Musculoskeletal:       Right shoulder: Normal.       Left shoulder: Normal.       Cervical back: Normal.  Lymphadenopathy:    She has no cervical adenopathy.  Neurological: She is alert and oriented to person, place, and time. She has normal reflexes. No cranial nerve deficit. She exhibits normal muscle tone. Coordination normal.  Skin: Skin is warm and dry. No rash noted. She is not diaphoretic. No erythema. No pallor.  Psychiatric: She has a normal mood and affect. Her behavior is normal. Judgment and thought content normal.  Nursing note and vitals reviewed.  Results for orders placed or performed in visit on 05/05/16  POCT urinalysis dipstick  Result Value Ref Range   Color, UA light yellow (A) yellow   Clarity, UA hazy (A) clear   Glucose, UA negative negative   Bilirubin, UA negative negative   Ketones, POC UA negative negative   Spec Grav, UA 1.010    Blood, UA negative negative   pH, UA 8.0    Protein Ur, POC negative negative   Urobilinogen, UA 0.2    Nitrite, UA Negative Negative   Leukocytes, UA Trace (A) Negative  POCT Microscopic Urinalysis ([facility])  Result Value Ref Range   WBC,UR,HPF,POC Few (A) None WBC/hpf   RBC,UR,HPF,POC None None RBC/hpf   Bacteria Few (A) None, Too numerous to count   Mucus Present (A) Absent   Epithelial Cells, UR Per Microscopy Few (A) None, Too numerous to count cells/hpf       Assessment & Plan:   1. Urination  pain    -New. -Send urine culture and GC/Chlam. -pt declined pelvic exam; will follow-up with gynecology if symptoms do not improve. -empirically treat with Cipro per patient request while awaiting results. -RTC fever/vomiting/flank pain.   Orders Placed This Encounter  Procedures  . POCT urinalysis dipstick  . POCT Microscopic Urinalysis ([facility])   Meds ordered this encounter  Medications  . acetaminophen (TYLENOL) 325 MG tablet    Sig: Take 650 mg by mouth every 6 (six) hours as needed.    No Follow-up on file.    Abdishakur Gottschall Paulita FujitaMartin Lillyn Wieczorek, M.D. [facility]  [place] [address] [place], [place]  [id number] [phone] phone [phone] fax

## 2016-05-05 NOTE — Patient Instructions (Addendum)
   IF you received an x-ray today, you will receive an invoice from [facility]. Please contact [facility] at [phone] with questions or concerns regarding your invoice.   IF you received labwork today, you will receive an invoice from [facility]. Please contact [facility] at [phone] with questions or concerns regarding your invoice.   Our billing staff will not be able to assist you with questions regarding bills from these companies.  You will be contacted with the lab results as soon as they are available. The fastest way to get your results is to activate your My Chart account. Instructions are located on the last page of this paperwork. If you have not heard from us regarding the results in 2 weeks, please contact this office.     Urinary Tract Infection Urinary tract infections (UTIs) can develop anywhere along your urinary tract. Your urinary tract is your body's drainage system for removing wastes and extra water. Your urinary tract includes two kidneys, two ureters, a bladder, and a urethra. Your kidneys are a pair of bean-shaped organs. Each kidney is about the size of your fist. They are located below your ribs, one on each side of your spine. CAUSES Infections are caused by microbes, which are microscopic organisms, including fungi, viruses, and bacteria. These organisms are so small that they can only be seen through a microscope. Bacteria are the microbes that most commonly cause UTIs. SYMPTOMS  Symptoms of UTIs may vary by age and gender of the patient and by the location of the infection. Symptoms in young women typically include a frequent and intense urge to urinate and a painful, burning feeling in the bladder or urethra during urination. Older women and men are more likely to be tired, shaky, and weak and have muscle aches and abdominal pain. A fever may mean the infection is in your kidneys. Other symptoms of a kidney  infection include pain in your back or sides below the ribs, nausea, and vomiting. DIAGNOSIS To diagnose a UTI, your caregiver will ask you about your symptoms. Your caregiver will also ask you to provide a urine sample. The urine sample will be tested for bacteria and white blood cells. White blood cells are made by your body to help fight infection. TREATMENT  Typically, UTIs can be treated with medication. Because most UTIs are caused by a bacterial infection, they usually can be treated with the use of antibiotics. The choice of antibiotic and length of treatment depend on your symptoms and the type of bacteria causing your infection. HOME CARE INSTRUCTIONS  If you were prescribed antibiotics, take them exactly as your caregiver instructs you. Finish the medication even if you feel better after you have only taken some of the medication.  Drink enough water and fluids to keep your urine clear or pale yellow.  Avoid caffeine, tea, and carbonated beverages. They tend to irritate your bladder.  Empty your bladder often. Avoid holding urine for long periods of time.  Empty your bladder before and after sexual intercourse.  After a bowel movement, women should cleanse from front to back. Use each tissue only once. SEEK MEDICAL CARE IF:   You have back pain.  You develop a fever.  Your symptoms do not begin to resolve within 3 days. SEEK IMMEDIATE MEDICAL CARE IF:   You have severe back pain or lower abdominal pain.  You develop chills.  You have nausea or vomiting.  You have continued burning or discomfort with urination.   MAKE SURE YOU:   Understand these instructions.  Will watch your condition.  Will get help right away if you are not doing well or get worse.   This information is not intended to replace advice given to you by your health care provider. Make sure you discuss any questions you have with your health care provider.   Document Released: 09/05/2005 Document  Revised: 08/17/2015 Document Reviewed: 01/04/2012 Elsevier Interactive Patient Education 2016 [employer]  

## 2016-05-07 LAB — URINE CULTURE

## 2016-05-08 LAB — GC/CHLAMYDIA PROBE AMP
CT PROBE, AMP APTIMA: NOT DETECTED
GC Probe RNA: NOT DETECTED

## 2016-05-30 ENCOUNTER — Telehealth: Payer: Self-pay | Admitting: Radiology

## 2016-05-30 NOTE — Telephone Encounter (Signed)
Patient called in and was advised of lab results

## 2016-06-26 ENCOUNTER — Other Ambulatory Visit: Payer: Self-pay | Admitting: Obstetrics and Gynecology

## 2018-05-01 ENCOUNTER — Encounter: Payer: Self-pay | Admitting: Family Medicine
